# Patient Record
Sex: Female | Born: 1940 | Hispanic: No | Smoking: Never smoker
Health system: Southern US, Community
[De-identification: ages and names within clinical notes are randomized; demographics above are authoritative.]

## PROBLEM LIST (undated history)

## (undated) DIAGNOSIS — E785 Hyperlipidemia, unspecified: Secondary | ICD-10-CM

## (undated) DIAGNOSIS — T7840XA Allergy, unspecified, initial encounter: Secondary | ICD-10-CM

## (undated) HISTORY — DX: Hyperlipidemia, unspecified: E78.5

## (undated) HISTORY — DX: Allergy, unspecified, initial encounter: T78.40XA

---

## 2000-01-05 ENCOUNTER — Other Ambulatory Visit: Admission: RE | Admit: 2000-01-05 | Discharge: 2000-01-05 | Payer: Self-pay | Admitting: Family Medicine

## 2006-11-19 ENCOUNTER — Encounter: Admission: RE | Admit: 2006-11-19 | Discharge: 2006-11-19 | Payer: Self-pay | Admitting: Obstetrics and Gynecology

## 2007-11-24 ENCOUNTER — Encounter: Admission: RE | Admit: 2007-11-24 | Discharge: 2007-11-24 | Payer: Self-pay | Admitting: Obstetrics and Gynecology

## 2008-11-24 ENCOUNTER — Encounter: Admission: RE | Admit: 2008-11-24 | Discharge: 2008-11-24 | Payer: Self-pay | Admitting: Family Medicine

## 2009-11-25 ENCOUNTER — Encounter: Admission: RE | Admit: 2009-11-25 | Discharge: 2009-11-25 | Payer: Self-pay | Admitting: Family Medicine

## 2010-10-23 ENCOUNTER — Other Ambulatory Visit: Payer: Self-pay | Admitting: Family Medicine

## 2010-10-23 DIAGNOSIS — Z1231 Encounter for screening mammogram for malignant neoplasm of breast: Secondary | ICD-10-CM

## 2010-12-01 ENCOUNTER — Ambulatory Visit
Admission: RE | Admit: 2010-12-01 | Discharge: 2010-12-01 | Disposition: A | Discharge status: Home / Self Care | Payer: Medicare Other | Source: Ambulatory Visit | Attending: Family Medicine | Admitting: Family Medicine

## 2010-12-01 DIAGNOSIS — Z1231 Encounter for screening mammogram for malignant neoplasm of breast: Secondary | ICD-10-CM

## 2011-10-25 ENCOUNTER — Other Ambulatory Visit: Payer: Self-pay | Admitting: Family Medicine

## 2011-10-25 DIAGNOSIS — Z1231 Encounter for screening mammogram for malignant neoplasm of breast: Secondary | ICD-10-CM

## 2011-12-03 ENCOUNTER — Ambulatory Visit
Admission: RE | Admit: 2011-12-03 | Discharge: 2011-12-03 | Disposition: A | Discharge status: Home / Self Care | Payer: Medicare Other | Source: Ambulatory Visit | Attending: Family Medicine | Admitting: Family Medicine

## 2011-12-03 DIAGNOSIS — Z1231 Encounter for screening mammogram for malignant neoplasm of breast: Secondary | ICD-10-CM

## 2012-09-13 ENCOUNTER — Ambulatory Visit (INDEPENDENT_AMBULATORY_CARE_PROVIDER_SITE_OTHER): Payer: Medicare Other | Admitting: Family Medicine

## 2012-09-13 VITALS — BP 130/82 | HR 80 | Temp 98.2°F | Resp 18 | Ht 58.75 in | Wt 119.0 lb

## 2012-09-13 DIAGNOSIS — E785 Hyperlipidemia, unspecified: Secondary | ICD-10-CM | POA: Diagnosis not present

## 2012-09-13 DIAGNOSIS — J309 Allergic rhinitis, unspecified: Secondary | ICD-10-CM | POA: Diagnosis not present

## 2012-09-13 LAB — LIPID PANEL
Cholesterol: 283 mg/dL — ABNORMAL HIGH (ref 0–200)
LDL Cholesterol: 173 mg/dL — ABNORMAL HIGH (ref 0–99)
Total CHOL/HDL Ratio: 6.9 Ratio
VLDL: 69 mg/dL — ABNORMAL HIGH (ref 0–40)

## 2012-09-13 LAB — COMPREHENSIVE METABOLIC PANEL
ALT: 49 U/L — ABNORMAL HIGH (ref 0–35)
AST: 34 U/L (ref 0–37)
Alkaline Phosphatase: 56 U/L (ref 39–117)
BUN: 10 mg/dL (ref 6–23)
CO2: 26 mEq/L (ref 19–32)
Chloride: 106 mEq/L (ref 96–112)
Creat: 0.86 mg/dL (ref 0.50–1.10)

## 2012-09-13 MED ORDER — FLUTICASONE PROPIONATE 50 MCG/ACT NA SUSP: 2.0000 | Freq: Every day | NASAL | Status: AC

## 2012-09-13 NOTE — Patient Instructions (Addendum)
Take Claritin (loratadine is the generic) one each morning for allergies.  This can be purchased without a prescription.  Use fluticasone nose spray 2 sprays in the side of the nose once daily  I will let you know the results of your labs

## 2012-09-13 NOTE — Progress Notes (Signed)
Subjective: 72 year old [place] lady who is here for her first first visit. She has several concerns. Every spring she has a lot of trouble with allergies and nasal congestion. Especially in the morning when she comes in for working in the garden. She does do a lot of work in her garden. She raises vegetables. She is married, has a couple of children who are grown and some grandchildren. She takes fish oil for possible hypercholesterolemia. She would like to be checked for her cholesterol and sugar. She is generally healthy however, and has no major acute complaints. Takes no prescription medications, but uses various supplements. She does have some problems with pains in her knees. A little bit of a difficult historian and it is difficult to know if the lipids were actually elevated or if she is just taking a medication to prevent it.  Objective: Pleasant lady in no major acute distress. She does not speak much [place]. TMs are normal. Nose clear. Throat clear. Neck supple without nodes or thyromegaly. Chest is clear to auscultation. Heart regular without murmurs gallops or arrhythmias. Abdomen soft, nontender.  Assessment: Seasonal allergic rhinitis Possible hyper lipidemia Mild DJD of knees  Plan: Check labs Fluticasone spray Take loratadine daily   Patient formerly went to [facility], but her doctor has left. She wants to see either Dr. Shanda Bumps Copland or me here in urgent care.

## 2012-09-17 ENCOUNTER — Other Ambulatory Visit: Payer: Self-pay | Admitting: *Deleted

## 2012-09-17 MED ORDER — PRAVASTATIN SODIUM 20 MG PO TABS: 20.0000 mg | ORAL_TABLET | Freq: Every day | ORAL | Status: DC

## 2012-10-27 ENCOUNTER — Other Ambulatory Visit: Payer: Self-pay

## 2012-10-27 DIAGNOSIS — Z1231 Encounter for screening mammogram for malignant neoplasm of breast: Secondary | ICD-10-CM

## 2012-12-08 ENCOUNTER — Ambulatory Visit
Admission: RE | Admit: 2012-12-08 | Discharge: 2012-12-08 | Disposition: A | Discharge status: Home / Self Care | Payer: Medicare Other | Source: Ambulatory Visit

## 2012-12-08 DIAGNOSIS — Z1231 Encounter for screening mammogram for malignant neoplasm of breast: Secondary | ICD-10-CM | POA: Diagnosis not present

## 2012-12-22 ENCOUNTER — Other Ambulatory Visit: Payer: Self-pay | Admitting: Family Medicine

## 2013-11-02 ENCOUNTER — Other Ambulatory Visit: Payer: Self-pay

## 2013-11-02 DIAGNOSIS — Z1231 Encounter for screening mammogram for malignant neoplasm of breast: Secondary | ICD-10-CM

## 2013-12-09 ENCOUNTER — Ambulatory Visit
Admission: RE | Admit: 2013-12-09 | Discharge: 2013-12-09 | Disposition: A | Discharge status: Home / Self Care | Payer: Medicare Other | Source: Ambulatory Visit

## 2013-12-09 DIAGNOSIS — Z1231 Encounter for screening mammogram for malignant neoplasm of breast: Secondary | ICD-10-CM | POA: Diagnosis not present

## 2013-12-12 ENCOUNTER — Ambulatory Visit (INDEPENDENT_AMBULATORY_CARE_PROVIDER_SITE_OTHER): Payer: Medicare Other | Admitting: Family Medicine

## 2013-12-12 VITALS — BP 140/68 | HR 73 | Temp 98.4°F | Resp 16 | Ht 58.75 in | Wt 117.6 lb

## 2013-12-12 DIAGNOSIS — E785 Hyperlipidemia, unspecified: Secondary | ICD-10-CM

## 2013-12-12 DIAGNOSIS — R079 Chest pain, unspecified: Secondary | ICD-10-CM

## 2013-12-12 DIAGNOSIS — H547 Unspecified visual loss: Secondary | ICD-10-CM | POA: Diagnosis not present

## 2013-12-12 DIAGNOSIS — Z0000 Encounter for general adult medical examination without abnormal findings: Secondary | ICD-10-CM | POA: Diagnosis not present

## 2013-12-12 DIAGNOSIS — H269 Unspecified cataract: Secondary | ICD-10-CM

## 2013-12-12 LAB — POCT CBC
GRANULOCYTE PERCENT: 49.2 % (ref 37–80)
HCT, POC: 44.3 % (ref 37.7–47.9)
HEMOGLOBIN: 14.1 g/dL (ref 12.2–16.2)
LYMPH, POC: 3.4 (ref 0.6–3.4)
MCH, POC: 28.9 pg (ref 27–31.2)
MCHC: 31.8 g/dL (ref 31.8–35.4)
MCV: 90.7 fL (ref 80–97)
MID (cbc): 0.3 (ref 0–0.9)
MPV: 8.5 fL (ref 0–99.8)
PLATELET COUNT, POC: 199 10*3/uL (ref 142–424)
POC GRANULOCYTE: 3.6 (ref 2–6.9)
POC LYMPH PERCENT: 46.1 %L (ref 10–50)
POC MID %: 4.7 %M (ref 0–12)
RBC: 4.88 M/uL (ref 4.04–5.48)
RDW, POC: 14.1 %
WBC: 7.3 10*3/uL (ref 4.6–10.2)

## 2013-12-12 LAB — COMPREHENSIVE METABOLIC PANEL
ALK PHOS: 50 U/L (ref 39–117)
ALT: 28 U/L (ref 0–35)
AST: 25 U/L (ref 0–37)
Albumin: 4.5 g/dL (ref 3.5–5.2)
BILIRUBIN TOTAL: 1 mg/dL (ref 0.2–1.2)
BUN: 13 mg/dL (ref 6–23)
CALCIUM: 9.7 mg/dL (ref 8.4–10.5)
CO2: 28 meq/L (ref 19–32)
CREATININE: 0.76 mg/dL (ref 0.50–1.10)
Chloride: 103 mEq/L (ref 96–112)
Glucose, Bld: 96 mg/dL (ref 70–99)
Potassium: 4.5 mEq/L (ref 3.5–5.3)
Sodium: 139 mEq/L (ref 135–145)
TOTAL PROTEIN: 7.9 g/dL (ref 6.0–8.3)

## 2013-12-12 LAB — LIPID PANEL
Cholesterol: 285 mg/dL — ABNORMAL HIGH (ref 0–200)
HDL: 44 mg/dL (ref >39)
LDL Cholesterol: 195 mg/dL — ABNORMAL HIGH (ref 0–99)
TRIGLYCERIDES: 232 mg/dL — AB (ref <150)
Total CHOL/HDL Ratio: 6.5 Ratio
VLDL: 46 mg/dL — ABNORMAL HIGH (ref 0–40)

## 2013-12-12 LAB — POCT GLYCOSYLATED HEMOGLOBIN (HGB A1C): HEMOGLOBIN A1C: 5.8

## 2013-12-12 NOTE — Progress Notes (Signed)
Physical exam:  History: 73 year old lady who is here for a physical exam. There is no clear record of her having had regular physical exams. Apparently she has had Pap smears on the place is, last one a few years ago, and would like a Pap smear. [facility], last one a few weeks. Has never had an abnormal Pap or mammogram. She's never had a screening colonoscopy. She is generally healthy. Occasional chest pains which she's been working in the garden for long time. Feels good. Has problems with her eye getting red at times and itching. She has problems with morning rhinitis. Zyrtec seems to help that.  Past medical history: Regular medications: None, she starts the pravastatin after a bowel. Allergies: None Surgeries: None Illnesses: None  Family history: Both parents are deceased  Social:  From [place], has been in the [place] over [place] years, speaks minimal [place]. Her daughter came to interpret. She is married, has 2 children, one here and one in [place]. 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 And winter she rides an exercise bicycle without problems. She does not smoke drink or use any substances. He has a high school education.  Review of systems: Constitutional: Unremarkable Respiratory: Unremarkable Cardiovascular: Minimal chest pain sometimes when she has been working in the garden for a long time.  Gastrointestinal: Unremarkable Genitourinary: Unremarkable Musculoskeletal: Aches in her joints Dermatologic: Unremarkable Neurologic some numbness in her legs and she's been working in the garden for a long time Vascular: Unremarkable HEENT: Her vision is not as good as it was. Endocrine: Unremarkable Psychiatric: Unremarkable. No depression.  Physical exam: Well-developed well-nourished lady in no acute distress. TMs normal. Eyes PERRLA. Slightly hazy bilaterally, suspicious for early cataracts. Throat clear. Neck supple without  nodes or thyromegaly. No carotid bruits. Chest clear process. Heart regular without murmurs gallops or arrhythmias. And soft the mass or tenderness. Pelvic and breast exam not done. Extremities unremarkable. Skin unremarkable.  Assessment: Annual physical exam Hyperlipidemia Nonspecific chest pains Decreased visual acuity and probable early cataracts  Arthritis, mild   Plan: Discussed screening. Annual mammography or semiannual mammography can be continued until age 73 and is probably no longer necessary. She still should probably get a screening colonoscopy since she is below 72 years of age. She no longer needs screening Pap smears at this age.  EKG nonspecific ST-T flattening and minimal anterior and inversions, thought to be of no significance  Will refer for ophthalmology evaluation and colonoscopy  Resume lipid lowering agents if her cholesterol is still high  No new medications    Addendum:  A separate OV is being charged for the hyperlipidemia. She has not been taking her medication, labs are being checked, and we'll contact her regarding the results of that. She wishes labs to be mailed to her.

## 2013-12-12 NOTE — Patient Instructions (Addendum)
Referral is being made to an eye specialist (ophthalmologist) to examine your eyes. I think he may be getting early cataracts.  Referral is being made to a gastroenterologist to consider getting a screening colonoscopy  After age 73 for you will not need any more mammograms.  At your age you no longer need screening Pap smears  I will let you know the results of your lab work and decide if you should be back on the cholesterol medicine. If your cholesterol is still high, you probably should be placed on medication and left on it for life.  I do not find anything of major concern with your heart, but if you have any worse chest pains you should return for checkup.  Take Tylenol 500 mg (acetaminophen) 2 tablets 2 or 3 times a day only when needed for your bones and joints aching.   If at any time you have severe chest pain go to the emergency room or call 911  Return at any time if problems, otherwise in one year for a recheck

## 2013-12-16 ENCOUNTER — Encounter: Payer: Self-pay | Admitting: Family Medicine

## 2013-12-17 ENCOUNTER — Other Ambulatory Visit: Payer: Self-pay | Admitting: *Deleted

## 2013-12-17 DIAGNOSIS — E785 Hyperlipidemia, unspecified: Secondary | ICD-10-CM

## 2013-12-17 MED ORDER — PRAVASTATIN SODIUM 20 MG PO TABS: 20.0000 mg | ORAL_TABLET | Freq: Every day | ORAL | Status: DC

## 2014-01-06 DIAGNOSIS — H40019 Open angle with borderline findings, low risk, unspecified eye: Secondary | ICD-10-CM | POA: Diagnosis not present

## 2014-01-06 DIAGNOSIS — H40039 Anatomical narrow angle, unspecified eye: Secondary | ICD-10-CM | POA: Diagnosis not present

## 2014-01-21 DIAGNOSIS — H2510 Age-related nuclear cataract, unspecified eye: Secondary | ICD-10-CM | POA: Diagnosis not present

## 2014-01-21 DIAGNOSIS — H4011X0 Primary open-angle glaucoma, stage unspecified: Secondary | ICD-10-CM | POA: Diagnosis not present

## 2014-01-21 DIAGNOSIS — H409 Unspecified glaucoma: Secondary | ICD-10-CM | POA: Diagnosis not present

## 2014-02-03 ENCOUNTER — Other Ambulatory Visit: Payer: Self-pay

## 2014-02-03 DIAGNOSIS — E785 Hyperlipidemia, unspecified: Secondary | ICD-10-CM

## 2014-02-03 MED ORDER — PRAVASTATIN SODIUM 20 MG PO TABS: 20.0000 mg | ORAL_TABLET | Freq: Every day | ORAL | Status: DC

## 2014-03-27 ENCOUNTER — Ambulatory Visit (INDEPENDENT_AMBULATORY_CARE_PROVIDER_SITE_OTHER): Payer: Medicare Other | Admitting: Family Medicine

## 2014-03-27 VITALS — BP 118/84 | HR 71 | Temp 97.8°F | Resp 16 | Ht 59.0 in | Wt 116.8 lb

## 2014-03-27 DIAGNOSIS — E785 Hyperlipidemia, unspecified: Secondary | ICD-10-CM

## 2014-03-27 LAB — LIPID PANEL
Cholesterol: 220 mg/dL — ABNORMAL HIGH (ref 0–200)
HDL: 43 mg/dL (ref >39)
LDL CALC: 135 mg/dL — AB (ref 0–99)
TRIGLYCERIDES: 209 mg/dL — AB (ref <150)
Total CHOL/HDL Ratio: 5.1 Ratio
VLDL: 42 mg/dL — ABNORMAL HIGH (ref 0–40)

## 2014-03-27 MED ORDER — PRAVASTATIN SODIUM 20 MG PO TABS: 20.0000 mg | ORAL_TABLET | Freq: Every day | ORAL | Status: DC

## 2014-03-27 NOTE — Progress Notes (Signed)
Subjective: 73 year old lady who had an elevated cholesterol at the time of her physical this summer. She was placed on pravastatin 20 mg daily and is here for a recheck of the labs. She has been healthy. She gets regular exercise riding a stationary bike and walking. She maintains a healthy weight. She is not having any other major symptoms. She has tolerated the medicine well.  Objective: No carotid bruits. Chest is clear. Heart regular without murmurs. No ankle edema.  Her husband who speaks better [place] than she does accompanied her here today.  Assessment: Hyperlipidemia, on medication, for recheck  Plan: Lipids,.  Cmet  return in 6 months if stable. If the cholesterol is too high I may tell her to come in sooner.  Offered flu shots and they declined

## 2014-03-27 NOTE — Patient Instructions (Signed)
Continue current medications.  We will plan to recheck you again in about 6 months, unless there are problems with the laboratory tests.  Return at anytime if necessary

## 2015-03-19 ENCOUNTER — Ambulatory Visit (INDEPENDENT_AMBULATORY_CARE_PROVIDER_SITE_OTHER): Payer: Medicare Other

## 2015-03-19 ENCOUNTER — Ambulatory Visit (INDEPENDENT_AMBULATORY_CARE_PROVIDER_SITE_OTHER): Payer: Medicare Other | Admitting: Family Medicine

## 2015-03-19 VITALS — BP 120/68 | HR 76 | Temp 97.9°F | Resp 18 | Ht 59.0 in | Wt 118.0 lb

## 2015-03-19 DIAGNOSIS — M545 Low back pain, unspecified: Secondary | ICD-10-CM

## 2015-03-19 DIAGNOSIS — Z8639 Personal history of other endocrine, nutritional and metabolic disease: Secondary | ICD-10-CM | POA: Diagnosis not present

## 2015-03-19 DIAGNOSIS — G8929 Other chronic pain: Secondary | ICD-10-CM

## 2015-03-19 DIAGNOSIS — I493 Ventricular premature depolarization: Secondary | ICD-10-CM

## 2015-03-19 DIAGNOSIS — R079 Chest pain, unspecified: Secondary | ICD-10-CM

## 2015-03-19 DIAGNOSIS — E785 Hyperlipidemia, unspecified: Secondary | ICD-10-CM

## 2015-03-19 LAB — COMPLETE METABOLIC PANEL WITH GFR
ALT: 22 U/L (ref 6–29)
AST: 21 U/L (ref 10–35)
Albumin: 4.4 g/dL (ref 3.6–5.1)
Alkaline Phosphatase: 51 U/L (ref 33–130)
BILIRUBIN TOTAL: 0.9 mg/dL (ref 0.2–1.2)
BUN: 14 mg/dL (ref 7–25)
CHLORIDE: 101 mmol/L (ref 98–110)
CO2: 26 mmol/L (ref 20–31)
CREATININE: 0.71 mg/dL (ref 0.60–0.93)
Calcium: 9.2 mg/dL (ref 8.6–10.4)
GFR, EST NON AFRICAN AMERICAN: 85 mL/min (ref >=60)
GLUCOSE: 94 mg/dL (ref 65–99)
Potassium: 4.5 mmol/L (ref 3.5–5.3)
Sodium: 141 mmol/L (ref 135–146)
TOTAL PROTEIN: 7.8 g/dL (ref 6.1–8.1)

## 2015-03-19 LAB — POCT CBC
Granulocyte percent: 46.9 %G (ref 37–80)
HCT, POC: 41.5 % (ref 37.7–47.9)
Hemoglobin: 14.5 g/dL (ref 12.2–16.2)
Lymph, poc: 3.3 (ref 0.6–3.4)
MCH: 30.1 pg (ref 27–31.2)
MCHC: 34.9 g/dL (ref 31.8–35.4)
MCV: 86.4 fL (ref 80–97)
MID (CBC): 0.3 (ref 0–0.9)
MPV: 8.1 fL (ref 0–99.8)
PLATELET COUNT, POC: 170 10*3/uL (ref 142–424)
POC Granulocyte: 3.2 (ref 2–6.9)
POC LYMPH %: 49 % (ref 10–50)
POC MID %: 4.1 %M (ref 0–12)
RBC: 4.8 M/uL (ref 4.04–5.48)
RDW, POC: 13.1 %
WBC: 6 10*3/uL (ref 4.6–10.2)

## 2015-03-19 LAB — POCT GLYCOSYLATED HEMOGLOBIN (HGB A1C): HEMOGLOBIN A1C: 5.7

## 2015-03-19 LAB — LIPID PANEL
Cholesterol: 242 mg/dL — ABNORMAL HIGH (ref 125–200)
HDL: 50 mg/dL (ref >=46)
LDL CALC: 153 mg/dL — AB (ref <130)
TRIGLYCERIDES: 193 mg/dL — AB (ref <150)
Total CHOL/HDL Ratio: 4.8 Ratio (ref <=5.0)
VLDL: 39 mg/dL — ABNORMAL HIGH (ref <30)

## 2015-03-19 LAB — HEMOGLOBIN A1C: HEMOGLOBIN A1C: 5.7 % (ref 4.0–6.0)

## 2015-03-19 NOTE — Progress Notes (Addendum)
Patient ID: Renee Butler, female    DOB: [birth year]  Age: 74 y.o. MRN: [id number]  Chief Complaint  Patient presents with  . Follow-up    HTN, diabetes, cholesterol    Subjective:   74 year old [place] American lady who is here for a regular checkup. Although listed as being diabetic. She in fact does not probably have diabetes. She does have a history of hypertension and hyperlipidemia. She is only on medicine for cholesterol however. She generally feels well. She stays active. She works in her garden. She does have a lot of low back pain when she is active. She occasionally gets some substernal chest pain. She does not have major shortness of breath. No GI or GU complaints. Musculoskeletal fairly unremarkable except for the back. She takes her medicine regularly for the cholesterol, and wondered whether the medicine and side effects. We discussed this. Her husband understands English better than she does.  Current allergies, medications, problem list, past/family and social histories reviewed.  Objective:  BP 120/68 mmHg  Pulse 76  Temp(Src) 97.9 F (36.6 C)  Resp 18  Ht 4\' 11"  (1.499 m)  Wt 118 lb (53.524 kg)  BMI 23.82 kg/m2  SpO2 98%  No major acute distress. Healthy appearing. TMs normal. Throat clear. Neck supple without significant nodes. Chest is clear to auscultation. Heart regular without murmurs. Breasts are small, soft without masses. Abdomen soft without masses or tenderness. Extremities unremarkable. She is a little tender in the lumbosacral region of her back but has fair range of motion.  Assessment & Plan:   Assessment: 1. Hyperlipidemia   2. Chest pain, unspecified chest pain type   3. Chronic midline low back pain without sciatica   4. History of hyperglycemia   5. Frequent PVCs       Plan: Check labs including lipids, CMP, hemoglobin A1c, CBC, and an EKG.  Results for orders placed or performed in visit on 03/19/15  POCT glycosylated hemoglobin (Hb A1C)   Result Value Ref Range   Hemoglobin A1C 5.7   POCT CBC  Result Value Ref Range   WBC 6.0 4.6 - 10.2 K/uL   Lymph, poc 3.3 0.6 - 3.4   POC LYMPH PERCENT 49.0 10 - 50 %L   MID (cbc) 0.3 0 - 0.9   POC MID % 4.1 0 - 12 %M   POC Granulocyte 3.2 2 - 6.9   Granulocyte percent 46.9 37 - 80 %G   RBC 4.80 4.04 - 5.48 M/uL   Hemoglobin 14.5 12.2 - 16.2 g/dL   HCT, POC 53.641.5 64.437.7 - 47.9 %   MCV 86.4 80 - 97 fL   MCH, POC 30.1 27 - 31.2 pg   MCHC 34.9 31.8 - 35.4 g/dL   RDW, POC 03.413.1 %   Platelet Count, POC 170 142 - 424 K/uL   MPV 8.1 0 - 99.8 fL     Orders Placed This Encounter  Procedures  . DG Lumbar Spine 2-3 Views    Order Specific Question:  Reason for Exam (SYMPTOM  OR DIAGNOSIS REQUIRED)    Answer:  lumbar pain    Order Specific Question:  Preferred imaging location?    Answer:  External  . Lipid panel  . COMPLETE METABOLIC PANEL WITH GFR  . POCT glycosylated hemoglobin (Hb A1C)  . POCT CBC  . EKG 12-Lead    EKG is normal except for frequent PVC's [facility] reading (PRIMARY) by  Dr. Alwyn RenHopper Lumbar arthritis    Patient Instructions  Continue  current medications.  We will let you know the results of your additional tests  If you have more chest pain please return for a recheck or go to the emergency room if necessary  Take over-the-counter acetaminophen (Tylenol) 500 mg 2 pills each morning for back pain.   patient refuses flu shot despite explanation. Return in about 1 year (around 03/18/2016).   Karlin Heilman, MD 03/19/2015

## 2015-03-19 NOTE — Patient Instructions (Addendum)
Continue current medications.  We will let you know the results of your additional tests  If you have more chest pain please return for a recheck or go to the emergency room if necessary  Take over-the-counter acetaminophen (Tylenol) 500 mg 2 pills each morning for back pain.

## 2015-04-04 ENCOUNTER — Encounter: Payer: Self-pay | Admitting: Family Medicine

## 2015-05-06 ENCOUNTER — Other Ambulatory Visit: Payer: Self-pay | Admitting: Family Medicine

## 2015-11-19 ENCOUNTER — Other Ambulatory Visit: Payer: Self-pay | Admitting: Family Medicine

## 2016-01-07 ENCOUNTER — Ambulatory Visit (INDEPENDENT_AMBULATORY_CARE_PROVIDER_SITE_OTHER): Payer: Medicare Other | Admitting: Emergency Medicine

## 2016-01-07 ENCOUNTER — Encounter: Payer: Self-pay | Admitting: Emergency Medicine

## 2016-01-07 VITALS — BP 138/74 | Resp 16 | Ht 58.75 in | Wt 115.6 lb

## 2016-01-07 DIAGNOSIS — R9431 Abnormal electrocardiogram [ECG] [EKG]: Secondary | ICD-10-CM | POA: Diagnosis not present

## 2016-01-07 DIAGNOSIS — Z1211 Encounter for screening for malignant neoplasm of colon: Secondary | ICD-10-CM | POA: Diagnosis not present

## 2016-01-07 DIAGNOSIS — I493 Ventricular premature depolarization: Secondary | ICD-10-CM

## 2016-01-07 DIAGNOSIS — H269 Unspecified cataract: Secondary | ICD-10-CM | POA: Diagnosis not present

## 2016-01-07 DIAGNOSIS — Z8639 Personal history of other endocrine, nutritional and metabolic disease: Secondary | ICD-10-CM

## 2016-01-07 DIAGNOSIS — Z23 Encounter for immunization: Secondary | ICD-10-CM | POA: Diagnosis not present

## 2016-01-07 DIAGNOSIS — E785 Hyperlipidemia, unspecified: Secondary | ICD-10-CM | POA: Diagnosis not present

## 2016-01-07 LAB — POCT CBC
Granulocyte percent: 49.8 %G (ref 37–80)
HCT, POC: 40.2 % (ref 37.7–47.9)
Hemoglobin: 13.8 g/dL (ref 12.2–16.2)
LYMPH, POC: 2.8 (ref 0.6–3.4)
MCH: 30 pg (ref 27–31.2)
MCHC: 34.3 g/dL (ref 31.8–35.4)
MCV: 87.6 fL (ref 80–97)
MID (CBC): 0.6 (ref 0–0.9)
MPV: 7.6 fL (ref 0–99.8)
POC Granulocyte: 3.4 (ref 2–6.9)
POC LYMPH %: 41.9 % (ref 10–50)
POC MID %: 8.3 % (ref 0–12)
Platelet Count, POC: 196 10*3/uL (ref 142–424)
RBC: 4.59 M/uL (ref 4.04–5.48)
RDW, POC: 13.3 %
WBC: 6.8 10*3/uL (ref 4.6–10.2)

## 2016-01-07 LAB — POCT URINALYSIS DIP (MANUAL ENTRY)
BILIRUBIN UA: NEGATIVE
Blood, UA: NEGATIVE
GLUCOSE UA: NEGATIVE
Ketones, POC UA: NEGATIVE
LEUKOCYTES UA: NEGATIVE
NITRITE UA: NEGATIVE
Protein Ur, POC: NEGATIVE
Spec Grav, UA: 1.01
Urobilinogen, UA: 0.2
pH, UA: 6

## 2016-01-07 LAB — COMPLETE METABOLIC PANEL WITH GFR
ALBUMIN: 4.3 g/dL (ref 3.6–5.1)
ALK PHOS: 53 U/L (ref 33–130)
ALT: 21 U/L (ref 6–29)
AST: 21 U/L (ref 10–35)
BUN: 13 mg/dL (ref 7–25)
CALCIUM: 9.2 mg/dL (ref 8.6–10.4)
CO2: 28 mmol/L (ref 20–31)
CREATININE: 0.82 mg/dL (ref 0.60–0.93)
Chloride: 104 mmol/L (ref 98–110)
GFR, Est African American: 82 mL/min (ref >=60)
GFR, Est Non African American: 71 mL/min (ref >=60)
GLUCOSE: 92 mg/dL (ref 65–99)
Potassium: 4.8 mmol/L (ref 3.5–5.3)
Sodium: 140 mmol/L (ref 135–146)
Total Bilirubin: 0.8 mg/dL (ref 0.2–1.2)
Total Protein: 7.9 g/dL (ref 6.1–8.1)

## 2016-01-07 LAB — LIPID PANEL
CHOLESTEROL: 229 mg/dL — AB (ref 125–200)
HDL: 49 mg/dL (ref >=46)
LDL Cholesterol: 139 mg/dL — ABNORMAL HIGH (ref <130)
Total CHOL/HDL Ratio: 4.7 Ratio (ref <=5.0)
Triglycerides: 206 mg/dL — ABNORMAL HIGH (ref <150)
VLDL: 41 mg/dL — AB (ref <30)

## 2016-01-07 MED ORDER — ATORVASTATIN CALCIUM 20 MG PO TABS: 20.0000 mg | ORAL_TABLET | Freq: Every day | ORAL | 3 refills | Status: AC

## 2016-01-07 NOTE — Patient Instructions (Addendum)
I have made you an appointment to see a cardiologist to review your EKG. I have made a referral for you to see the eye doctor. Please stop the pravastatin you have at home for cholesterol and I have changed you to a better cholesterol drug called atorvastatin    IF you received an x-ray today, you will receive an invoice from [facility]. Please contact [facility] at [phone] with questions or concerns regarding your invoice.   IF you received labwork today, you will receive an invoice from [facility]. Please contact [facility] at [phone] with questions or concerns regarding your invoice.   Our billing staff will not be able to assist you with questions regarding bills from these companies.  You will be contacted with the lab results as soon as they are available. The fastest way to get your results is to activate your My Chart account. Instructions are located on the last page of this paperwork. If you have not heard from us regarding the results in 2 weeks, please contact this office.

## 2016-01-07 NOTE — Progress Notes (Signed)
By signing my name below, I, Stann Ore, attest that this documentation has been prepared under the direction and in the presence of Lesle Chris, MD. Electronically Signed: Stann Ore, Scribe. 01/07/2016 , 8:37 AM .  Patient was seen in room 3 .  Chief Complaint:  Chief Complaint  Patient presents with  . Annual Exam    HPI: Renee Butler is a 75 y.o. female who reports to [facility] today for annual physical. Patient notes having occasional arthralgia in her knees. She denies taking any OTC medications.   Cancer Screening She has not had a mammogram done yet, and will schedule.  She will receive cologuard home testing to send in.   Vision She hasn't seen an eye doctor. Her daughter goes to [facility] [place] on [address].  [address] [place], [place], [place] [address] Phone: [phone]  Immunizations She has not received a pneumonia shot, and will receive today.  She declines flu shot today.   HLD Patient has history of HLD and is only taking pravachol 20mg , even though she is prescribed to take 40mg .   General [place]. Her husband is still living.  She is brought in by her daughter, who translated for the visit. Patient's primary language is [place]   Past Medical History:  Diagnosis Date  . Allergy   . Hyperlipidemia    No past surgical history on file. Social History   Social History  . Marital status: Unknown    Spouse name: N/A  . Number of children: N/A  . Years of education: N/A   Social History Main Topics  . Smoking status: Never Smoker  . Smokeless tobacco: None  . Alcohol use No  . Drug use: No  . Sexual activity: Not Asked   Other Topics Concern  . None   Social History Narrative  . None   No family history on file. No Known Allergies Prior to Admission medications   Medication Sig Start Date End Date Taking? Authorizing Provider  Ascorbic Acid (VITAMIN C) 500 MG CAPS Take by mouth daily.    Yes Historical Provider, MD  cetirizine (ZYRTEC) 10 MG tablet Take 10 mg by mouth daily.   Yes Historical Provider, MD  glucosamine-chondroitin 500-400 MG tablet Take 1 tablet by mouth 3 (three) times daily.   Yes Historical Provider, MD  pravastatin (PRAVACHOL) 40 MG tablet TAKE 1 TABLET BY MOUTH DAILY 11/22/15  Yes Peyton Najjar, MD  vitamin E (VITAMIN E) 400 UNIT capsule Take 400 Units by mouth daily.   Yes Historical Provider, MD  fluticasone (FLONASE) 50 MCG/ACT nasal spray Place 2 sprays into the nose daily. Patient not taking: Reported on 03/19/2015 09/13/12   Peyton Najjar, MD     ROS:  Constitutional: negative for fever, chills, night sweats, weight changes, or fatigue  HEENT: negative for vision changes, hearing loss, congestion, rhinorrhea, ST, epistaxis, or sinus pressure Cardiovascular: negative for chest pain or palpitations Respiratory: negative for hemoptysis, wheezing, shortness of breath, or cough Abdominal: negative for abdominal pain, nausea, vomiting, diarrhea, or constipation Dermatological: negative for rash Neurologic: negative for headache, dizziness, or syncope All other systems reviewed and are otherwise negative with the exception to those above and in the HPI.  PHYSICAL EXAM: Vitals:   01/07/16 0817  BP: 138/74  Resp: 16   Body mass index is 23.55 kg/m.   General: Alert, no acute distress HEENT:  Normocephalic, atraumatic, oropharynx patent.  [facility]: [facility] Cardiovascular:  Regular rate and rhythm, no rubs murmurs or gallops.  No Carotid bruits, radial pulse intact. No pedal edema.  Respiratory: Clear to auscultation bilaterally.  No wheezes, rales, or rhonchi.  No cyanosis, no use of accessory musculature Abdominal: No organomegaly, abdomen is soft and non-tender, positive bowel sounds. No masses. Musculoskeletal: Gait intact. No edema, tenderness Skin: No rashes. Neurologic: Facial musculature symmetric. Psychiatric: Patient acts appropriately  throughout our interaction.  Lymphatic: No cervical or submandibular lymphadenopathy Genitourinary/Anorectal: No acute findings   LABS: Results for orders placed or performed in visit on 01/07/16  POCT CBC  Result Value Ref Range   WBC 6.8 4.6 - 10.2 K/uL   Lymph, poc 2.8 0.6 - 3.4   POC LYMPH PERCENT 41.9 10 - 50 %L   MID (cbc) 0.6 0 - 0.9   POC MID % 8.3 0 - 12 %M   POC Granulocyte 3.4 2 - 6.9   Granulocyte percent 49.8 37 - 80 %G   RBC 4.59 4.04 - 5.48 M/uL   Hemoglobin 13.8 12.2 - 16.2 g/dL   HCT, POC 40.940.2 81.137.7 - 47.9 %   MCV 87.6 80 - 97 fL   MCH, POC 30.0 27 - 31.2 pg   MCHC 34.3 31.8 - 35.4 g/dL   RDW, POC 91.413.3 %   Platelet Count, POC 196 142 - 424 K/uL   MPV 7.6 0 - 99.8 fL  POCT urinalysis dipstick  Result Value Ref Range   Color, UA yellow yellow   Clarity, UA clear clear   Glucose, UA negative negative   Bilirubin, UA negative negative   Ketones, POC UA negative negative   Spec Grav, UA 1.010    Blood, UA negative negative   pH, UA 6.0    Protein Ur, POC negative negative   Urobilinogen, UA 0.2    Nitrite, UA Negative Negative   Leukocytes, UA Negative Negative    EKG/XRAY:   Normal sinus rhythm. T-wave inversion V1 to V3.  ASSESSMENT/PLAN: Referral made to the cardiologist for T-wave inversion V1 to V3 similar to previous EKGs. Referral made to an eye doctor regarding her cataracts. I changed her pravastatin to atorvastatin get better cholesterol control. Her last cholesterol was greater than 240. She refused colonoscopy. She refused a flu shot. She will schedule her own mammogram.I personally performed the services described in this documentation, which was scribed in my presence. The recorded information has been reviewed and is accurate.  Gross sideeffects, risk and benefits, and alternatives of medications d/w patient. Patient is aware that all medications have potential sideeffects and we are unable to predict every sideeffect or drug-drug interaction  that may occur.  Lesle ChrisSteven Princeton Nabor MD 01/07/2016 8:21 AM

## 2016-01-10 LAB — POC HEMOCCULT BLD/STL (HOME/3-CARD/SCREEN)
FECAL OCCULT BLD: NEGATIVE
FECAL OCCULT BLD: NEGATIVE
Fecal Occult Blood, POC: NEGATIVE

## 2016-01-10 NOTE — Addendum Note (Signed)
Addended by: Isaac BlissGALLOWAY, Alessandra Sawdey J on: 01/10/2016 08:57 AM   Modules accepted: Orders

## 2016-01-18 ENCOUNTER — Telehealth: Payer: Self-pay

## 2016-01-18 NOTE — Telephone Encounter (Signed)
Pt is looking for lab results-pt husband is in office to be seen and asked that  We put in the message not sure of reason if it is lab resuls  Best number [phone]

## 2016-02-01 ENCOUNTER — Encounter: Payer: Self-pay | Admitting: *Deleted

## 2016-02-24 DIAGNOSIS — R9431 Abnormal electrocardiogram [ECG] [EKG]: Secondary | ICD-10-CM | POA: Diagnosis not present

## 2016-02-24 DIAGNOSIS — R079 Chest pain, unspecified: Secondary | ICD-10-CM | POA: Diagnosis not present

## 2016-02-24 DIAGNOSIS — E782 Mixed hyperlipidemia: Secondary | ICD-10-CM | POA: Diagnosis not present

## 2017-02-12 DIAGNOSIS — H25013 Cortical age-related cataract, bilateral: Secondary | ICD-10-CM | POA: Diagnosis not present

## 2017-02-23 ENCOUNTER — Ambulatory Visit (INDEPENDENT_AMBULATORY_CARE_PROVIDER_SITE_OTHER): Payer: Medicare Other | Admitting: Physician Assistant

## 2017-02-23 ENCOUNTER — Encounter: Payer: Self-pay | Admitting: Physician Assistant

## 2017-02-23 VITALS — BP 132/80 | HR 76 | Temp 98.8°F | Resp 18 | Ht 58.75 in | Wt 115.2 lb

## 2017-02-23 DIAGNOSIS — R937 Abnormal findings on diagnostic imaging of other parts of musculoskeletal system: Secondary | ICD-10-CM

## 2017-02-23 DIAGNOSIS — Z0000 Encounter for general adult medical examination without abnormal findings: Secondary | ICD-10-CM | POA: Diagnosis not present

## 2017-02-23 DIAGNOSIS — Z131 Encounter for screening for diabetes mellitus: Secondary | ICD-10-CM

## 2017-02-23 NOTE — Addendum Note (Signed)
Addended by: Ofilia NeasLARK, Tomeka Kantner L on: 02/23/2017 09:11 AM   Modules accepted: Level of Service

## 2017-02-23 NOTE — Patient Instructions (Signed)
     IF you received an x-ray today, you will receive an invoice from [facility]. Please contact [facility] at [phone] with questions or concerns regarding your invoice.   IF you received labwork today, you will receive an invoice from [facility]. Please contact [facility] at [phone] with questions or concerns regarding your invoice.   Our billing staff will not be able to assist you with questions regarding bills from these companies.  You will be contacted with the lab results as soon as they are available. The fastest way to get your results is to activate your My Chart account. Instructions are located on the last page of this paperwork. If you have not heard from us regarding the results in 2 weeks, please contact this office.     

## 2017-02-23 NOTE — Progress Notes (Signed)
Presents today for Medicare Visit.   Interpreter used for this visit? no  Other items to address today: Noe  Cancer Screening: Cervical (every2 years - ages 21-65): no Breast (annually - ages 2-75): yes Colon (every 10 years - ages 14-75): no, patient refused Prostate (annually - ages > 33): no   Other screening: Bone density screening (every 2 years - ages >63): None, ordered today.  [place] for AAA (males 65-75 - smoking history - once): Never smoker ETOH use: None Dental visits: None Vision visits: None Typical Meals: Variable Typical Beverages: None Exercise: Likes to ride an indoor bicycle.   Lab Screening: Last screening for diabetes (annually): will screen today Last lipid screening (every 5 years): yes, see    ADVANCE DIRECTIVES: Discussed: yes On File: no Materials Provided: yes   Depression screen [facility] 2/9 02/23/2017 01/07/2016  Decreased Interest 0 0  Down, Depressed, Hopeless 0 0  PHQ - 2 Score 0 0     Functional Status Survey: Is the patient deaf or have difficulty hearing?: Yes Does the patient have difficulty seeing, even when wearing glasses/contacts?: Yes Does the patient have difficulty concentrating, remembering, or making decisions?: Yes Does the patient have difficulty walking or climbing stairs?: Yes Does the patient have difficulty dressing or bathing?: No Does the patient have difficulty doing errands alone such as visiting a doctor's office or shopping?: Yes   Fall Risk  02/23/2017 01/07/2016  Falls in the past year? No No      Immunization status:  Immunization History  Administered Date(s) Administered  . Pneumococcal Conjugate-13 01/07/2016    Health Maintenance Due  Topic Date Due  . COLONOSCOPY  11/19/1991  . DEXA SCAN  11/19/2006  . PNA vac Low Risk Adult (2 of 2 - PPSV23) 01/06/2017    Patient Care Team: Patient, No Pcp Per as PCP - General (General Practice)   Patient Active Problem List   Diagnosis Date Noted    . Nonspecific abnormal electrocardiogram (ECG) (EKG) 01/07/2016  . Other and unspecified hyperlipidemia 12/12/2013     Past Medical History:  Diagnosis Date  . Allergy   . Hyperlipidemia      History reviewed. No pertinent surgical history.   History reviewed. No pertinent family history.   Social History   Social History  . Marital status: Unknown    Spouse name: N/A  . Number of children: N/A  . Years of education: N/A   Occupational History  . Not on file.   Social History Main Topics  . Smoking status: Never Smoker  . Smokeless tobacco: Never Used  . Alcohol use No  . Drug use: No  . Sexual activity: Not on file   Other Topics Concern  . Not on file   Social History Narrative  . No narrative on file     No Known Allergies  Prior to Admission medications   Medication Sig Start Date End Date Taking? Authorizing Provider  Ascorbic Acid (VITAMIN C) 500 MG CAPS Take by mouth daily.   Yes [provider]  atorvastatin (LIPITOR) 20 MG tablet Take 1 tablet (20 mg total) by mouth daily. 01/07/16  Yes Collene Gobble, MD  cetirizine (ZYRTEC) 10 MG tablet Take 10 mg by mouth daily.   Yes [provider]  glucosamine-chondroitin 500-400 MG tablet Take 1 tablet by mouth 3 (three) times daily.   Yes [provider]  vitamin E (VITAMIN E) 400 UNIT capsule Take 400 Units by mouth daily.  Yes [provider]  fluticasone (FLONASE) 50 MCG/ACT nasal spray Place 2 sprays into the nose daily. Patient not taking: Reported on 03/19/2015 09/13/12   Peyton NajjarHopper, David H, MD   Lab Results  Component Value Date   CHOL 229 (H) 01/07/2016   HDL 49 01/07/2016   LDLCALC 139 (H) 01/07/2016   TRIG 206 (H) 01/07/2016   CHOLHDL 4.7 01/07/2016      ELECTROCARDIOGRAM (once at welcome to medicare) Done    PHYSICAL EXAM: BP 132/80 (BP Location: Left Arm, Patient Position: Sitting, Cuff Size: Normal)   Pulse 76   Temp 98.8 F (37.1 C) (Oral)    Resp 18   Ht 4' 10.75" (1.492 m)   Wt 115 lb 3.2 oz (52.3 kg)   SpO2 97%   BMI 23.47 kg/m   Wt Readings from Last 3 Encounters:  02/23/17 115 lb 3.2 oz (52.3 kg)  01/07/16 115 lb 9.6 oz (52.4 kg)  03/19/15 118 lb (53.5 kg)      Visual Acuity Screening   Right eye Left eye Both eyes  Without correction: 20/40 20/40 20/50   With correction:     Hearing Screening Comments: Pt couldn't understand language.  Physical Exam  Constitutional: She is oriented to person, place, and time. She appears well-developed and well-nourished. No distress.  Eyes: Pupils are equal, round, and reactive to light.  Cardiovascular: Normal rate.   Respiratory: Effort normal. No respiratory distress.  GI: She exhibits no distension.  Neurological: She is alert and oriented to person, place, and time.  Skin: Skin is warm and dry. She is not diaphoretic.  Psychiatric: She has a normal mood and affect. Her behavior is normal. Judgment and thought content normal.     Education/Counseling: yes diet and exercise yes prevention of chronic diseases yes smoking/tobacco cessation yes review "Covered Medicare Preventive Services"    ASSESSMENT/PLAN: Encounter for initial preventive physical examination covered by Medicare  Abnormal bone density screening - Plan: DG Bone Density  Screening for diabetes mellitus - Plan: Hemoglobin A1c  Deliah BostonMichael [facility] 02/23/2017 8:58 AM

## 2017-02-24 LAB — HEMOGLOBIN A1C
Est. average glucose Bld gHb Est-mCnc: 111 mg/dL
Hgb A1c MFr Bld: 5.5 % (ref 4.8–5.6)

## 2017-02-25 IMAGING — CR DG LUMBAR SPINE 2-3V
3 series · 3 of 3 positions shown · non-contrast
Comparison: None.

CLINICAL DATA: Chronic low back pain without sciatica.

EXAM:
LUMBAR SPINE - 2-3 VIEW

[AP]
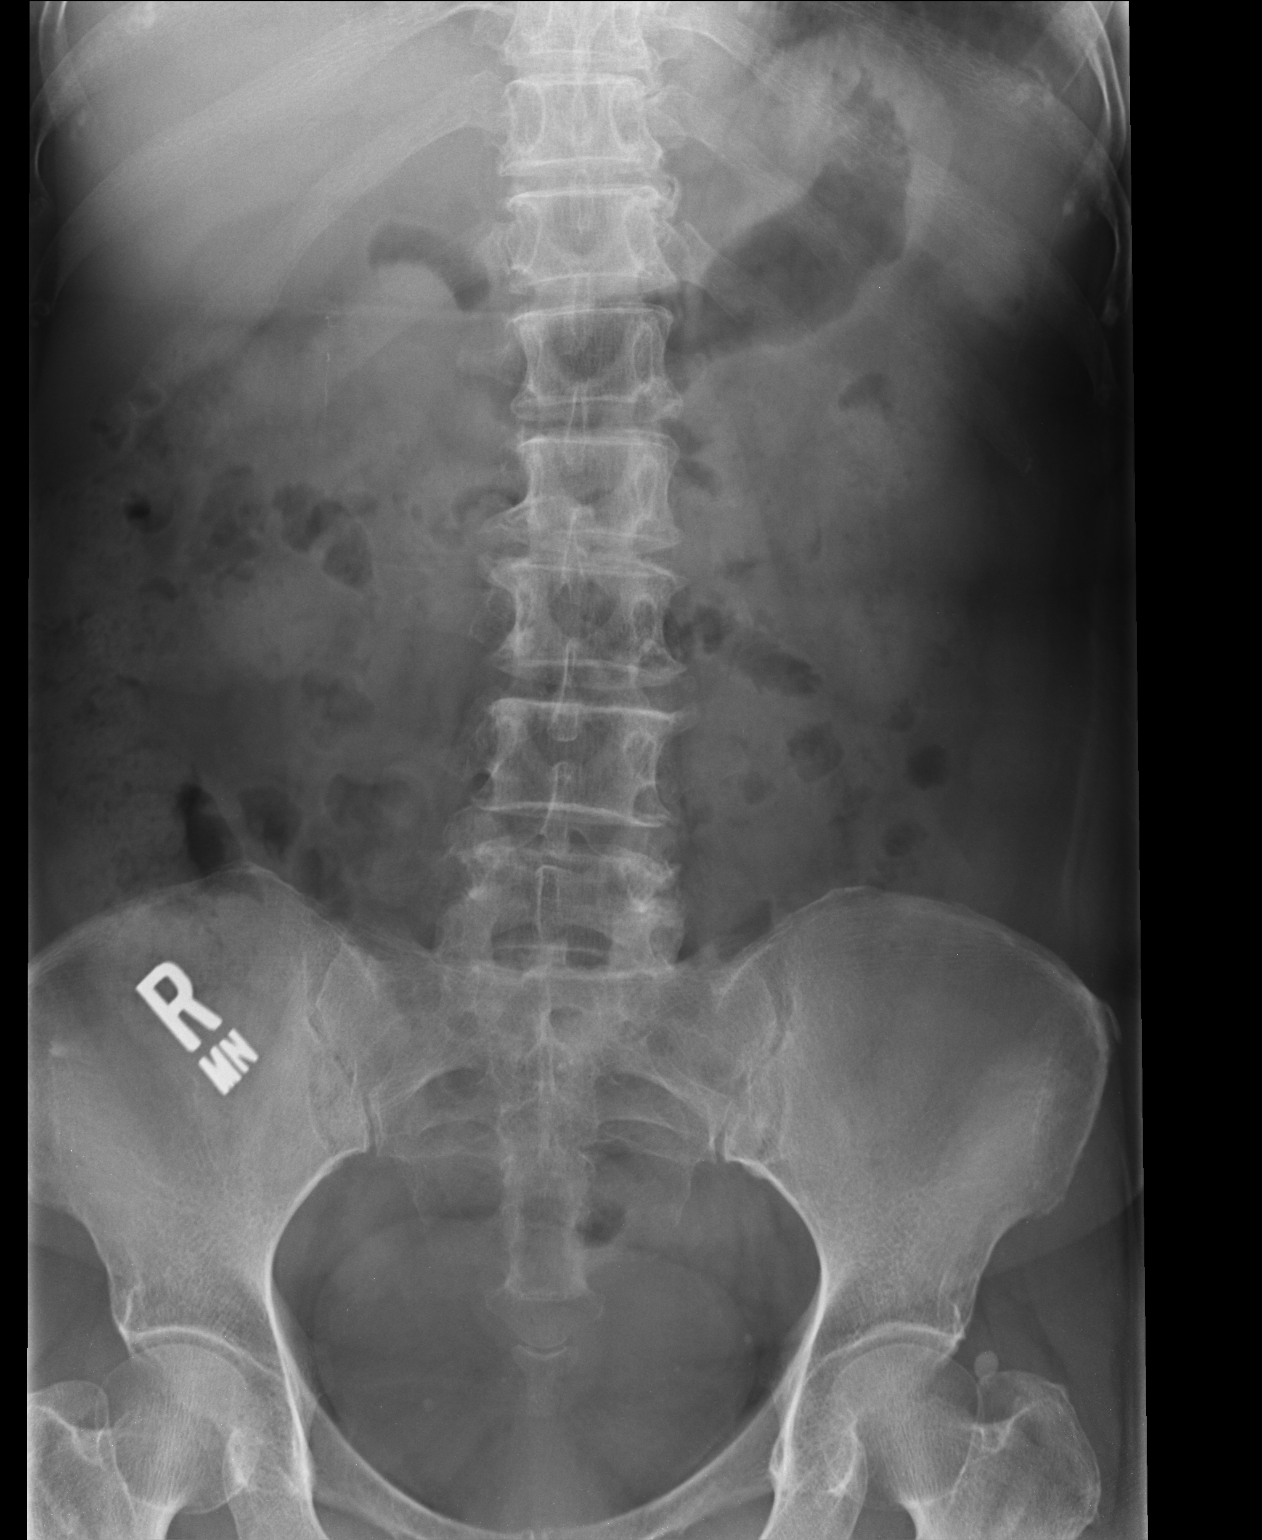

[lateral]
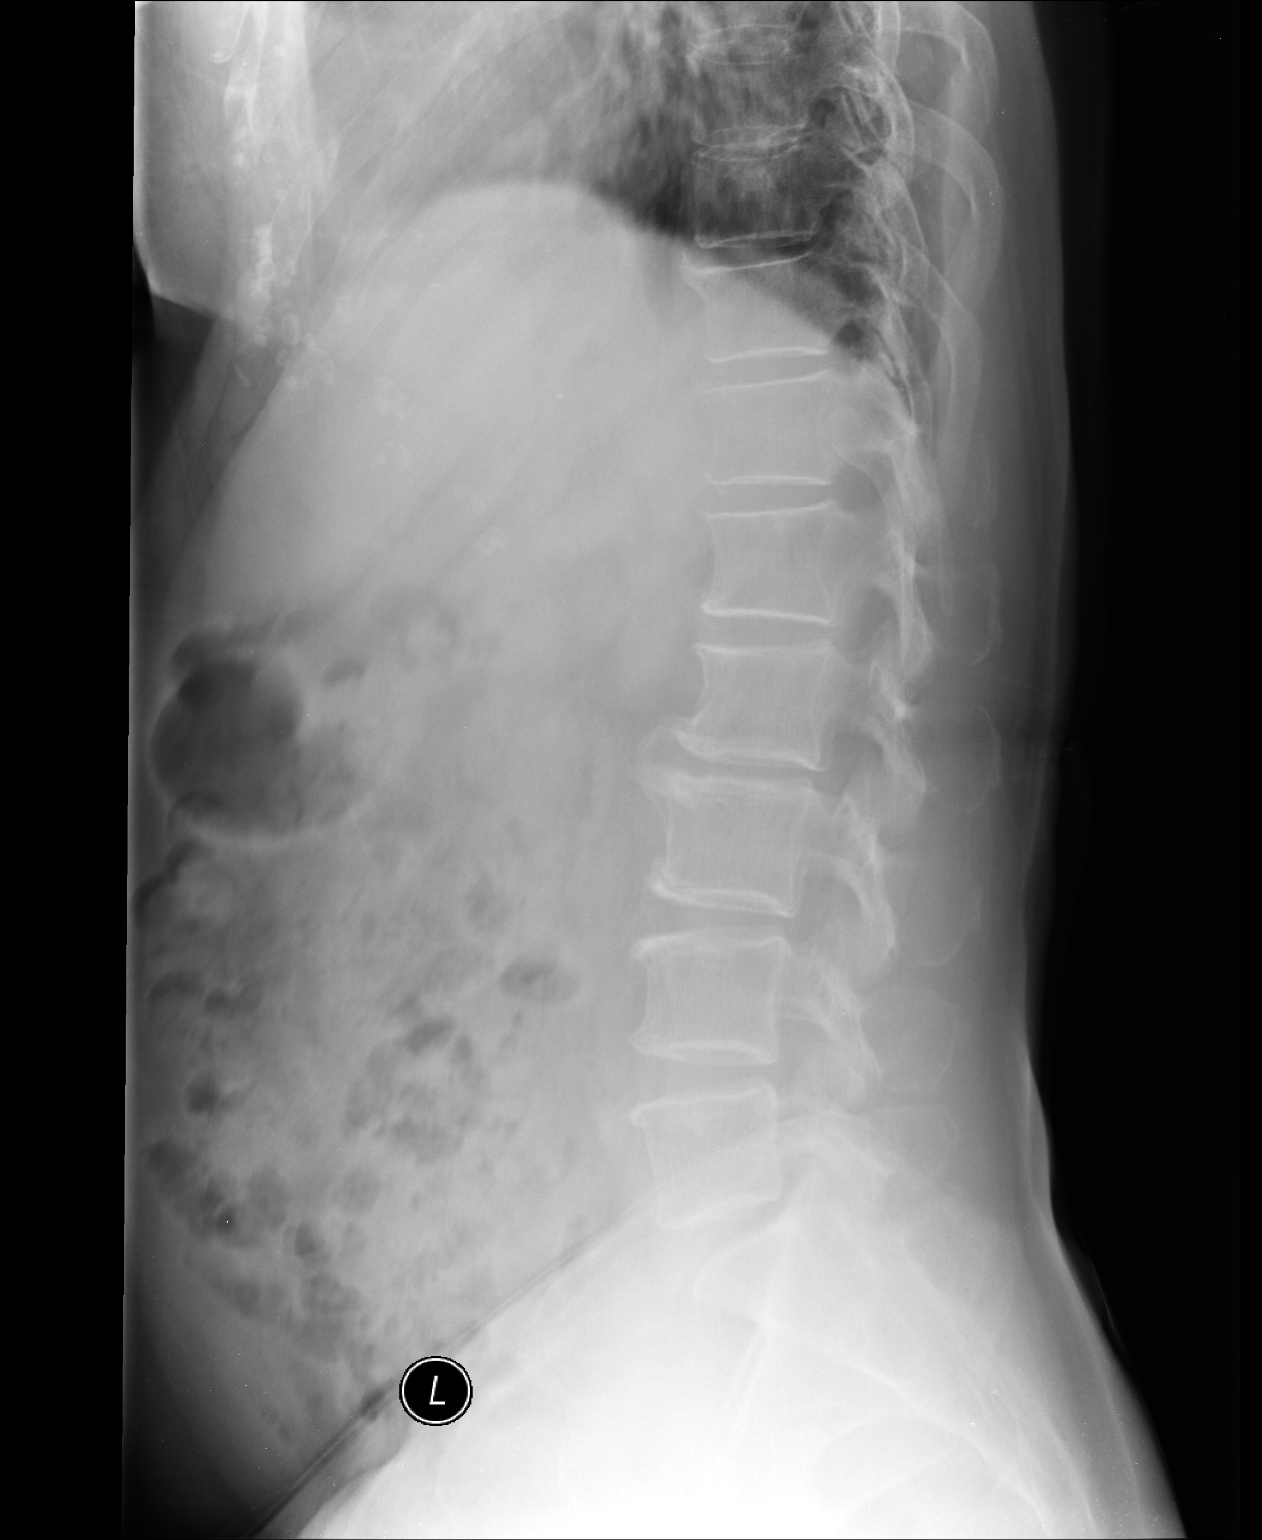

[l5 s1]
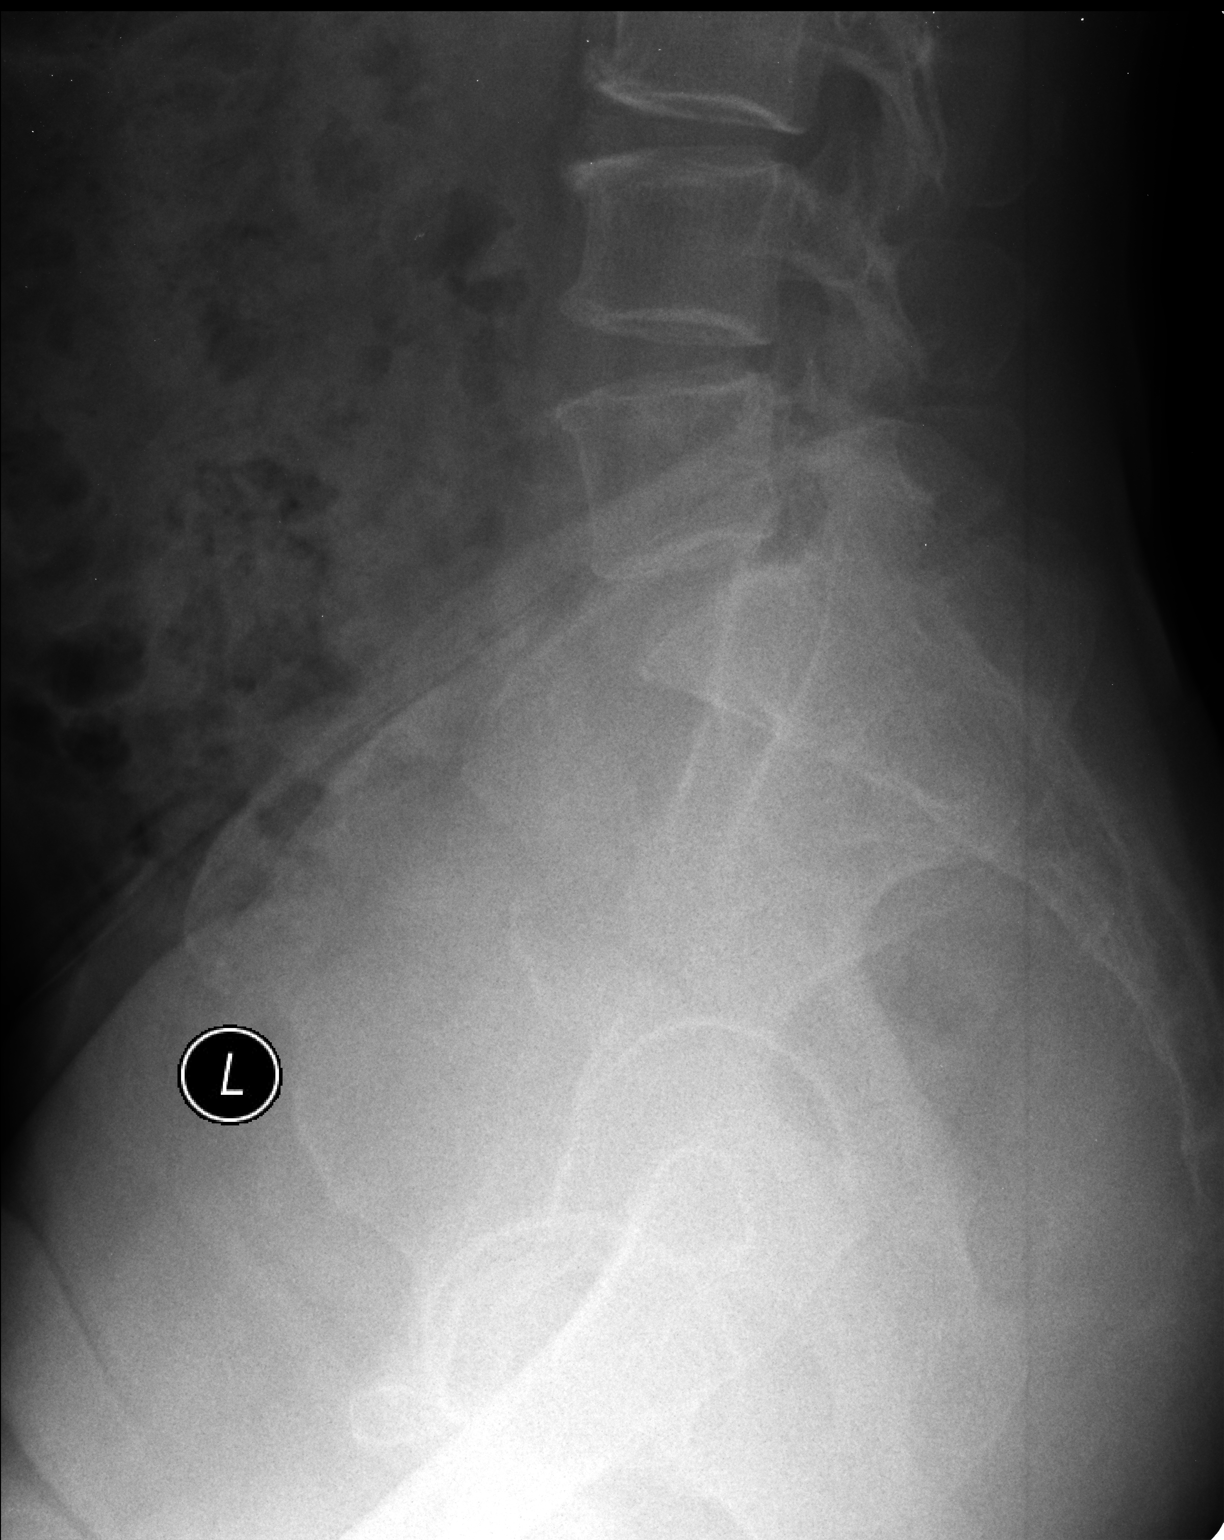

[3 of 3 positions shown; findings below may reference images not displayed]

FINDINGS: Diffuse lower thoracic and lumbar spondylosis and degenerative
change. These changes are most pronounced at L2-3 where there is
disc space narrowing, sclerosis and anterior osteophytes. Preserved
vertebral body heights. No acute compression fracture. Pedicles
appear intact. Symmetric SI joints. Normal bowel gas pattern. No
abnormal calcifications.
IMPRESSION: Lumbar degenerative changes and spondylosis.  No acute finding.

## 2017-02-25 NOTE — Progress Notes (Signed)
Please make patient aware of results via letter. In the context of her overall presentation any abnormal values are of no clinical significance.  Mesiah Manzo PA-C, 02/25/2017 8:52 AM

## 2019-07-06 ENCOUNTER — Ambulatory Visit: Payer: Medicare Other | Attending: Internal Medicine

## 2019-07-06 DIAGNOSIS — Z23 Encounter for immunization: Secondary | ICD-10-CM | POA: Insufficient documentation

## 2019-07-06 NOTE — Progress Notes (Signed)
   Covid-19 Vaccination Clinic  Name:  Renee Butler    MRN: [id number] DOB: Sep 13, 1940  07/06/2019  Renee Butler was observed post Covid-19 immunization for 15 minutes without incidence. She was provided with Vaccine Information Sheet and instruction to access the V-Safe system.   Renee Butler was instructed to call 911 with any severe reactions post vaccine: Marland Kitchen Difficulty breathing  . Swelling of your face and throat  . A fast heartbeat  . A bad rash all over your body  . Dizziness and weakness    Immunizations Administered    Name Date Dose VIS Date Route   Pfizer COVID-19 Vaccine 07/06/2019  8:16 AM 0.3 mL 04/17/2019 Intramuscular   Manufacturer: [employer], [employer]   Lot: [id number]   NDC: [address]-6

## 2019-08-04 ENCOUNTER — Ambulatory Visit: Payer: Medicare Other | Attending: Internal Medicine

## 2019-08-04 DIAGNOSIS — Z23 Encounter for immunization: Secondary | ICD-10-CM

## 2019-08-04 NOTE — Progress Notes (Signed)
   Covid-19 Vaccination Clinic  Name:  Renee Butler    MRN: [id number] DOB: [birth year]  08/04/2019  Ms. [name] was observed post Covid-19 immunization for 15 minutes without incident. She was provided with Vaccine Information Sheet and instruction to access the V-Safe system.   Ms. [name] was instructed to call 911 with any severe reactions post vaccine: Marland Kitchen Difficulty breathing  . Swelling of face and throat  . A fast heartbeat  . A bad rash all over body  . Dizziness and weakness   Immunizations Administered    Name Date Dose VIS Date Route   Pfizer COVID-19 Vaccine 08/04/2019  8:23 AM 0.3 mL 04/17/2019 Intramuscular   Manufacturer: [employer], [employer]   Lot: [id number]   NDC: [address]-6

## 2019-12-17 DIAGNOSIS — R202 Paresthesia of skin: Secondary | ICD-10-CM | POA: Diagnosis not present

## 2019-12-17 DIAGNOSIS — Z23 Encounter for immunization: Secondary | ICD-10-CM | POA: Diagnosis not present

## 2019-12-17 DIAGNOSIS — Z1159 Encounter for screening for other viral diseases: Secondary | ICD-10-CM | POA: Diagnosis not present

## 2019-12-17 DIAGNOSIS — M25561 Pain in right knee: Secondary | ICD-10-CM | POA: Diagnosis not present

## 2019-12-17 DIAGNOSIS — Z7689 Persons encountering health services in other specified circumstances: Secondary | ICD-10-CM | POA: Diagnosis not present

## 2019-12-17 DIAGNOSIS — E2839 Other primary ovarian failure: Secondary | ICD-10-CM | POA: Diagnosis not present

## 2019-12-17 DIAGNOSIS — E782 Mixed hyperlipidemia: Secondary | ICD-10-CM | POA: Diagnosis not present

## 2019-12-17 DIAGNOSIS — Z0000 Encounter for general adult medical examination without abnormal findings: Secondary | ICD-10-CM | POA: Diagnosis not present

## 2019-12-17 DIAGNOSIS — G8929 Other chronic pain: Secondary | ICD-10-CM | POA: Diagnosis not present

## 2019-12-18 DIAGNOSIS — Z23 Encounter for immunization: Secondary | ICD-10-CM | POA: Diagnosis not present

## 2019-12-18 DIAGNOSIS — Z0000 Encounter for general adult medical examination without abnormal findings: Secondary | ICD-10-CM | POA: Diagnosis not present

## 2019-12-18 DIAGNOSIS — E782 Mixed hyperlipidemia: Secondary | ICD-10-CM | POA: Diagnosis not present

## 2019-12-18 DIAGNOSIS — Z7689 Persons encountering health services in other specified circumstances: Secondary | ICD-10-CM | POA: Diagnosis not present

## 2019-12-18 DIAGNOSIS — E2839 Other primary ovarian failure: Secondary | ICD-10-CM | POA: Diagnosis not present

## 2019-12-18 DIAGNOSIS — R202 Paresthesia of skin: Secondary | ICD-10-CM | POA: Diagnosis not present

## 2019-12-18 DIAGNOSIS — M25561 Pain in right knee: Secondary | ICD-10-CM | POA: Diagnosis not present

## 2019-12-18 DIAGNOSIS — G8929 Other chronic pain: Secondary | ICD-10-CM | POA: Diagnosis not present

## 2019-12-18 DIAGNOSIS — Z1159 Encounter for screening for other viral diseases: Secondary | ICD-10-CM | POA: Diagnosis not present

## 2019-12-22 DIAGNOSIS — M810 Age-related osteoporosis without current pathological fracture: Secondary | ICD-10-CM | POA: Diagnosis not present

## 2019-12-22 DIAGNOSIS — M1711 Unilateral primary osteoarthritis, right knee: Secondary | ICD-10-CM | POA: Diagnosis not present

## 2019-12-22 DIAGNOSIS — G8929 Other chronic pain: Secondary | ICD-10-CM | POA: Diagnosis not present

## 2019-12-22 DIAGNOSIS — E2839 Other primary ovarian failure: Secondary | ICD-10-CM | POA: Diagnosis not present

## 2019-12-22 DIAGNOSIS — Z780 Asymptomatic menopausal state: Secondary | ICD-10-CM | POA: Diagnosis not present

## 2019-12-22 DIAGNOSIS — M25461 Effusion, right knee: Secondary | ICD-10-CM | POA: Diagnosis not present

## 2019-12-28 DIAGNOSIS — E782 Mixed hyperlipidemia: Secondary | ICD-10-CM | POA: Diagnosis not present

## 2019-12-28 DIAGNOSIS — M810 Age-related osteoporosis without current pathological fracture: Secondary | ICD-10-CM | POA: Diagnosis not present

## 2020-02-15 DIAGNOSIS — G8929 Other chronic pain: Secondary | ICD-10-CM | POA: Diagnosis not present

## 2020-02-15 DIAGNOSIS — M810 Age-related osteoporosis without current pathological fracture: Secondary | ICD-10-CM | POA: Diagnosis not present

## 2020-02-15 DIAGNOSIS — E782 Mixed hyperlipidemia: Secondary | ICD-10-CM | POA: Diagnosis not present

## 2020-02-15 DIAGNOSIS — M5431 Sciatica, right side: Secondary | ICD-10-CM | POA: Diagnosis not present

## 2020-02-15 DIAGNOSIS — M25561 Pain in right knee: Secondary | ICD-10-CM | POA: Diagnosis not present

## 2020-02-28 DIAGNOSIS — Z23 Encounter for immunization: Secondary | ICD-10-CM | POA: Diagnosis not present
